# Patient Record
Sex: Female | Born: 1960
Health system: Southern US, Community
[De-identification: ages and names within clinical notes are randomized; demographics above are authoritative.]

## PROBLEM LIST (undated history)

## (undated) DIAGNOSIS — J45909 Unspecified asthma, uncomplicated: Secondary | ICD-10-CM

## (undated) HISTORY — DX: Unspecified asthma, uncomplicated: J45.909

---

## 1984-12-13 HISTORY — PX: TUBAL LIGATION: SHX77

## 2009-06-25 HISTORY — PX: FEMUR FRACTURE SURGERY: SHX633

## 2019-04-09 ENCOUNTER — Encounter: Payer: Self-pay | Admitting: Physician Assistant

## 2019-04-09 ENCOUNTER — Ambulatory Visit (INDEPENDENT_AMBULATORY_CARE_PROVIDER_SITE_OTHER): Payer: No Typology Code available for payment source | Admitting: Physician Assistant

## 2019-04-09 VITALS — Wt 260.0 lb

## 2019-04-09 DIAGNOSIS — Z7689 Persons encountering health services in other specified circumstances: Secondary | ICD-10-CM

## 2019-04-09 DIAGNOSIS — I890 Lymphedema, not elsewhere classified: Secondary | ICD-10-CM | POA: Diagnosis not present

## 2019-04-09 DIAGNOSIS — Z124 Encounter for screening for malignant neoplasm of cervix: Secondary | ICD-10-CM

## 2019-04-09 DIAGNOSIS — L819 Disorder of pigmentation, unspecified: Secondary | ICD-10-CM | POA: Diagnosis not present

## 2019-04-09 DIAGNOSIS — M545 Low back pain: Secondary | ICD-10-CM

## 2019-04-09 DIAGNOSIS — G8929 Other chronic pain: Secondary | ICD-10-CM

## 2019-04-09 NOTE — Progress Notes (Signed)
I have discussed the procedure for the virtual visit with the patient who has given consent to proceed with assessment and treatment.   Shamir Tuzzolino R Shanin Szymanowski, RN     
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
## 2019-05-07 ENCOUNTER — Encounter: Payer: Self-pay | Admitting: Orthopaedic Surgery

## 2019-05-07 ENCOUNTER — Ambulatory Visit (INDEPENDENT_AMBULATORY_CARE_PROVIDER_SITE_OTHER): Payer: No Typology Code available for payment source | Admitting: Orthopaedic Surgery

## 2019-05-07 ENCOUNTER — Other Ambulatory Visit: Payer: Self-pay

## 2019-05-07 ENCOUNTER — Ambulatory Visit (INDEPENDENT_AMBULATORY_CARE_PROVIDER_SITE_OTHER): Payer: No Typology Code available for payment source

## 2019-05-07 VITALS — Ht 62.0 in | Wt 260.0 lb

## 2019-05-07 DIAGNOSIS — G8929 Other chronic pain: Secondary | ICD-10-CM

## 2019-05-07 DIAGNOSIS — M545 Low back pain: Secondary | ICD-10-CM

## 2019-05-07 MED ORDER — PREDNISONE 10 MG (21) PO TBPK: ORAL_TABLET | ORAL | 0 refills | Status: DC

## 2019-05-07 MED ORDER — METHOCARBAMOL 500 MG PO TABS: 500.0000 mg | ORAL_TABLET | Freq: Two times a day (BID) | ORAL | 0 refills | Status: DC | PRN
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
## 2019-06-11 ENCOUNTER — Other Ambulatory Visit: Payer: Self-pay | Admitting: Obstetrics & Gynecology

## 2019-06-11 DIAGNOSIS — E2839 Other primary ovarian failure: Secondary | ICD-10-CM

## 2019-08-04 ENCOUNTER — Ambulatory Visit
Admission: RE | Admit: 2019-08-04 | Discharge: 2019-08-04 | Disposition: A | Discharge status: Home / Self Care | Payer: No Typology Code available for payment source | Source: Ambulatory Visit | Attending: Obstetrics & Gynecology | Admitting: Obstetrics & Gynecology

## 2019-08-04 ENCOUNTER — Other Ambulatory Visit: Payer: Self-pay

## 2019-08-04 DIAGNOSIS — E2839 Other primary ovarian failure: Secondary | ICD-10-CM

## 2019-10-29 ENCOUNTER — Other Ambulatory Visit: Payer: Self-pay

## 2019-10-29 ENCOUNTER — Ambulatory Visit (INDEPENDENT_AMBULATORY_CARE_PROVIDER_SITE_OTHER): Payer: No Typology Code available for payment source | Admitting: Physician Assistant

## 2019-10-29 ENCOUNTER — Encounter: Payer: Self-pay | Admitting: Physician Assistant

## 2019-10-29 VITALS — BP 116/82 | HR 70 | Temp 98.9°F | Resp 16 | Ht 62.0 in | Wt 263.2 lb

## 2019-10-29 DIAGNOSIS — Z0000 Encounter for general adult medical examination without abnormal findings: Secondary | ICD-10-CM

## 2019-10-29 LAB — CBC WITH DIFFERENTIAL/PLATELET
Basophils Absolute: 0.1 10*3/uL (ref 0.0–0.1)
Basophils Relative: 1.2 % (ref 0.0–3.0)
Eosinophils Absolute: 0.1 10*3/uL (ref 0.0–0.7)
Eosinophils Relative: 2.8 % (ref 0.0–5.0)
HCT: 43.2 % (ref 36.0–46.0)
Hemoglobin: 14.5 g/dL (ref 12.0–15.0)
Lymphocytes Relative: 30.5 % (ref 12.0–46.0)
Lymphs Abs: 1.6 10*3/uL (ref 0.7–4.0)
MCHC: 33.5 g/dL (ref 30.0–36.0)
MCV: 91.1 fl (ref 78.0–100.0)
Monocytes Absolute: 0.4 10*3/uL (ref 0.1–1.0)
Monocytes Relative: 8.4 % (ref 3.0–12.0)
Neutro Abs: 3 10*3/uL (ref 1.4–7.7)
Neutrophils Relative %: 57.1 % (ref 43.0–77.0)
Platelets: 254 10*3/uL (ref 150.0–400.0)
RBC: 4.74 Mil/uL (ref 3.87–5.11)
RDW: 13.1 % (ref 11.5–15.5)
WBC: 5.3 10*3/uL (ref 4.0–10.5)

## 2019-10-29 LAB — COMPREHENSIVE METABOLIC PANEL
ALT: 15 U/L (ref 0–35)
AST: 13 U/L (ref 0–37)
Albumin: 4.2 g/dL (ref 3.5–5.2)
Alkaline Phosphatase: 84 U/L (ref 39–117)
BUN: 15 mg/dL (ref 6–23)
CO2: 31 mEq/L (ref 19–32)
Calcium: 9.3 mg/dL (ref 8.4–10.5)
Chloride: 105 mEq/L (ref 96–112)
Creatinine, Ser: 0.7 mg/dL (ref 0.40–1.20)
GFR: 85.51 mL/min (ref >60.00)
Glucose, Bld: 96 mg/dL (ref 70–99)
Potassium: 3.9 mEq/L (ref 3.5–5.1)
Sodium: 141 mEq/L (ref 135–145)
Total Bilirubin: 0.6 mg/dL (ref 0.2–1.2)
Total Protein: 6.2 g/dL (ref 6.0–8.3)

## 2019-10-29 LAB — LIPID PANEL
Cholesterol: 172 mg/dL (ref 0–200)
HDL: 46.3 mg/dL (ref >39.00)
LDL Cholesterol: 110 mg/dL — ABNORMAL HIGH (ref 0–99)
NonHDL: 125.79
Total CHOL/HDL Ratio: 4
Triglycerides: 81 mg/dL (ref 0.0–149.0)
VLDL: 16.2 mg/dL (ref 0.0–40.0)

## 2019-10-29 LAB — HEMOGLOBIN A1C: Hgb A1c MFr Bld: 5.4 % (ref 4.6–6.5)

## 2019-10-29 LAB — TSH: TSH: 2.6 u[IU]/mL (ref 0.35–4.50)

## 2019-10-29 NOTE — Progress Notes (Signed)
Patient presents to clinic today for annual exam.  Patient is fasting for labs.  Diet -- Poor diet recently with COVID. Has started a class with RD recently. Wants to see a counselor about her issues with weight, emotions. Depressed mood or anhedonia.   Acute Concerns: Denies acute concerns today.   Health Maintenance: Immunizations -- Flu shot up-to-date. Tetanus up-to-date.  Colonoscopy -- Last in 2017. Normal per patient.  Had a [place].  She will look for records.  Mammogram -- UTD.  Had recently with GYN.  Records requested..  Normal per patient.  PAP -- Last in 2020 per patient. Normal per patient..  Hep C -- Has had previously negative.  HIV --declined screening.   History reviewed. No pertinent past medical history.  Past Surgical History:  Procedure Laterality Date  . FEMUR FRACTURE SURGERY Left 06/25/2009   compound  . TUBAL LIGATION  12/1984    Current Outpatient Medications on File Prior to Visit  Medication Sig Dispense Refill  . methocarbamol (ROBAXIN) 500 MG tablet Take 1 tablet (500 mg total) by mouth 2 (two) times daily as needed for muscle spasms. (Patient not taking: Reported on 10/29/2019) 20 tablet 0  . predniSONE (STERAPRED UNI-PAK 21 TAB) 10 MG (21) TBPK tablet Take as directed (Patient not taking: Reported on 10/29/2019) 21 tablet 0   No current facility-administered medications on file prior to visit.    No Known Allergies  Family History  Problem Relation Age of Onset  . Heart disease Mother   . Hypertension Mother   . COPD Father   . COPD Sister   . COPD Brother   . COPD Sister     Social History   Socioeconomic History  . Marital status: Married    Spouse name: Not on file  . Number of children: Not on file  . Years of education: Not on file  . Highest education level: Not on file  Occupational History  . Occupation: Dentist: [employer]  Tobacco Use  . Smoking status: Former Smoker    Types: Cigarettes      Quit date: 1983    Years since quitting: 38.0  . Smokeless tobacco: Never Used  Substance and Sexual Activity  . Alcohol use: Not Currently  . Drug use: Never  . Sexual activity: Not on file  Other Topics Concern  . Not on file  Social History Narrative  . Not on file   Social Determinants of Health   Financial Resource Strain:   . Difficulty of Paying Living Expenses: Not on file  Food Insecurity:   . Worried About Charity fundraiser in the Last Year: Not on file  . Ran Out of Food in the Last Year: Not on file  Transportation Needs:   . Lack of Transportation (Medical): Not on file  . Lack of Transportation (Non-Medical): Not on file  Physical Activity: Unknown  . Days of Exercise per Week: 0 days  . Minutes of Exercise per Session: Not on file  Stress:   . Feeling of Stress : Not on file  Social Connections:   . Frequency of Communication with Friends and Family: Not on file  . Frequency of Social Gatherings with Friends and Family: Not on file  . Attends Religious Services: Not on file  . Active Member of Clubs or Organizations: Not on file  . Attends Archivist Meetings: Not on file  . Marital Status: Not on file  Intimate Partner Violence:   . Fear of Current or Ex-Partner: Not on file  . Emotionally Abused: Not on file  . Physically Abused: Not on file  . Sexually Abused: Not on file   Review of Systems  Constitutional: Negative for fever and weight loss.  HENT: Negative for ear discharge, ear pain, hearing loss and tinnitus.   Eyes: Negative for blurred vision, double vision, photophobia and pain.  Respiratory: Negative for cough and shortness of breath.   Cardiovascular: Negative for chest pain and palpitations.  Gastrointestinal: Negative for abdominal pain, blood in stool, constipation, diarrhea, heartburn, melena, nausea and vomiting.  Genitourinary: Negative for dysuria, flank pain, frequency, hematuria and urgency.  Musculoskeletal:  Negative for falls.  Neurological: Negative for dizziness, loss of consciousness and headaches.  Endo/Heme/Allergies: Negative for environmental allergies.  Psychiatric/Behavioral: Negative for depression, hallucinations, substance abuse and suicidal ideas. The patient is not nervous/anxious and does not have insomnia.    BP 116/82   Pulse 70   Temp 98.9 F (37.2 C)   Resp 16   Ht 5' 2"  (1.575 m)   Wt 263 lb 3.2 oz (119.4 kg)   SpO2 98%   BMI 48.14 kg/m   Physical Exam Vitals reviewed.  HENT:     Head: Normocephalic and atraumatic.     Right Ear: Tympanic membrane, ear canal and external ear normal.     Left Ear: Tympanic membrane, ear canal and external ear normal.     Nose: Nose normal. No mucosal edema.     Mouth/Throat:     Pharynx: Uvula midline. No oropharyngeal exudate or posterior oropharyngeal erythema.  Eyes:     Conjunctiva/sclera: Conjunctivae normal.     Pupils: Pupils are equal, round, and reactive to light.  Neck:     Thyroid: No thyromegaly.  Cardiovascular:     Rate and Rhythm: Normal rate and regular rhythm.     Heart sounds: Normal heart sounds.  Pulmonary:     Effort: Pulmonary effort is normal. No respiratory distress.     Breath sounds: Normal breath sounds. No wheezing or rales.  Abdominal:     General: Bowel sounds are normal. There is no distension.     Palpations: Abdomen is soft. There is no mass.     Tenderness: There is no abdominal tenderness. There is no guarding or rebound.  Musculoskeletal:     Cervical back: Neck supple.  Lymphadenopathy:     Cervical: No cervical adenopathy.  Skin:    General: Skin is warm and dry.     Findings: No rash.  Neurological:     Mental Status: She is alert and oriented to person, place, and time.     Cranial Nerves: No cranial nerve deficit.    Assessment/Plan: 1. Visit for preventive health examination Depression screen negative. Health Maintenance reviewed Declines HIV screen. Has had before so  will document this. Prior neg Hep C screen per patient. Preventive schedule discussed and handout given in AVS. Will obtain fasting labs today.  - CBC w/Diff - Comp Met (CMET) - Hemoglobin A1c - Lipid Profile - TSH  2. Morbid obesity ([place]) Is seeing registered dietitian.  Dietary and exercise recommendations reviewed with patient.  She would like to see a counselor to discuss emotional eating and barriers to her weight loss.  Handout provided for our counseling services.  Patient to schedule appointment. - Comp Met (CMET) - Hemoglobin A1c - Lipid Profile - TSH This visit occurred during the SARS-CoV-2 public health emergency.  Safety protocols were in place, including screening questions prior to the visit, additional usage of staff PPE, and extensive cleaning of exam room while observing appropriate contact time as indicated for disinfecting solutions.      Leeanne Rio, PA-C

## 2019-10-29 NOTE — Patient Instructions (Signed)
Please go to the lab for blood work.   Our office will call you with your results unless you have chosen to receive results via MyChart.  If your blood work is normal we will follow-up each year for physicals and as scheduled for chronic medical problems.  If anything is abnormal we will treat accordingly and get you in for a follow-up.  Work up to a goal of 150 minutes of aerobic activity per week. Try to watch portion sizes.   Use the handout given to schedule a counseling appointment.    Preventive Care 46-35 Years Old, Female Preventive care refers to visits with your health care provider and lifestyle choices that can promote health and wellness. This includes:  A yearly physical exam. This may also be called an annual well check.  Regular dental visits and eye exams.  Immunizations.  Screening for certain conditions.  Healthy lifestyle choices, such as eating a healthy diet, getting regular exercise, not using drugs or products that contain nicotine and tobacco, and limiting alcohol use. What can I expect for my preventive care visit? Physical exam Your health care provider will check your:  Height and weight. This may be used to calculate body mass index (BMI), which tells if you are at a healthy weight.  Heart rate and blood pressure.  Skin for abnormal spots. Counseling Your health care provider may ask you questions about your:  Alcohol, tobacco, and drug use.  Emotional well-being.  Home and relationship well-being.  Sexual activity.  Eating habits.  Work and work Statistician.  Method of birth control.  Menstrual cycle.  Pregnancy history. What immunizations do I need?  Influenza (flu) vaccine  This is recommended every year. Tetanus, diphtheria, and pertussis (Tdap) vaccine  You may need a Td booster every 10 years. Varicella (chickenpox) vaccine  You may need this if you have not been vaccinated. Zoster (shingles) vaccine  You may need  this after age 40. Measles, mumps, and rubella (MMR) vaccine  You may need at least one dose of MMR if you were born in 1957 or later. You may also need a second dose. Pneumococcal conjugate (PCV13) vaccine  You may need this if you have certain conditions and were not previously vaccinated. Pneumococcal polysaccharide (PPSV23) vaccine  You may need one or two doses if you smoke cigarettes or if you have certain conditions. Meningococcal conjugate (MenACWY) vaccine  You may need this if you have certain conditions. Hepatitis A vaccine  You may need this if you have certain conditions or if you travel or work in places where you may be exposed to hepatitis A. Hepatitis B vaccine  You may need this if you have certain conditions or if you travel or work in places where you may be exposed to hepatitis B. Haemophilus influenzae type b (Hib) vaccine  You may need this if you have certain conditions. Human papillomavirus (HPV) vaccine  If recommended by your health care provider, you may need three doses over 6 months. You may receive vaccines as individual doses or as more than one vaccine together in one shot (combination vaccines). Talk with your health care provider about the risks and benefits of combination vaccines. What tests do I need? Blood tests  Lipid and cholesterol levels. These may be checked every 5 years, or more frequently if you are over 56 years old.  Hepatitis C test.  Hepatitis B test. Screening  Lung cancer screening. You may have this screening every year starting at age  55 if you have a 30-pack-year history of smoking and currently smoke or have quit within the past 15 years.  Colorectal cancer screening. All adults should have this screening starting at age 67 and continuing until age 20. Your health care provider may recommend screening at age 39 if you are at increased risk. You will have tests every 1-10 years, depending on your results and the type of  screening test.  Diabetes screening. This is done by checking your blood sugar (glucose) after you have not eaten for a while (fasting). You may have this done every 1-3 years.  Mammogram. This may be done every 1-2 years. Talk with your health care provider about when you should start having regular mammograms. This may depend on whether you have a family history of breast cancer.  BRCA-related cancer screening. This may be done if you have a family history of breast, ovarian, tubal, or peritoneal cancers.  Pelvic exam and Pap test. This may be done every 3 years starting at age 71. Starting at age 58, this may be done every 5 years if you have a Pap test in combination with an HPV test. Other tests  Sexually transmitted disease (STD) testing.  Bone density scan. This is done to screen for osteoporosis. You may have this scan if you are at high risk for osteoporosis. Follow these instructions at home: Eating and drinking  Eat a diet that includes fresh fruits and vegetables, whole grains, lean protein, and low-fat dairy.  Take vitamin and mineral supplements as recommended by your health care provider.  Do not drink alcohol if: ? Your health care provider tells you not to drink. ? You are pregnant, may be pregnant, or are planning to become pregnant.  If you drink alcohol: ? Limit how much you have to 0-1 drink a day. ? Be aware of how much alcohol is in your drink. In the [place] one drink equals one 12 oz bottle of beer (355 mL), one 5 oz glass of wine (148 mL), or one 1 oz glass of hard liquor (44 mL). Lifestyle  Take daily care of your teeth and gums.  Stay active. Exercise for at least 30 minutes on 5 or more days each week.  Do not use any products that contain nicotine or tobacco, such as cigarettes, e-cigarettes, and chewing tobacco. If you need help quitting, ask your health care provider.  If you are sexually active, practice safe sex. Use a condom or other form of birth  control (contraception) in order to prevent pregnancy and STIs (sexually transmitted infections).  If told by your health care provider, take low-dose aspirin daily starting at age 30. What's next?  Visit your health care provider once a year for a well check visit.  Ask your health care provider how often you should have your eyes and teeth checked.  Stay up to date on all vaccines. This information is not intended to replace advice given to you by your health care provider. Make sure you discuss any questions you have with your health care provider. Document Revised: 06/12/2018 Document Reviewed: 06/12/2018 Elsevier Patient Education  [employer]. .

## 2019-11-12 ENCOUNTER — Other Ambulatory Visit: Payer: Self-pay

## 2019-11-12 ENCOUNTER — Encounter: Payer: Self-pay | Admitting: Physician Assistant

## 2019-11-12 ENCOUNTER — Ambulatory Visit (INDEPENDENT_AMBULATORY_CARE_PROVIDER_SITE_OTHER): Payer: No Typology Code available for payment source | Admitting: Physician Assistant

## 2019-11-12 DIAGNOSIS — Z20822 Contact with and (suspected) exposure to covid-19: Secondary | ICD-10-CM | POA: Diagnosis not present

## 2019-11-12 MED ORDER — BENZONATATE 100 MG PO CAPS: 100.0000 mg | ORAL_CAPSULE | Freq: Three times a day (TID) | ORAL | 0 refills | Status: AC | PRN

## 2019-11-12 NOTE — Progress Notes (Signed)
I have discussed the procedure for the virtual visit with the patient who has given consent to proceed with assessment and treatment.   Patient unable to get vitals at the visit  Con Memos, CMA

## 2019-11-12 NOTE — Patient Instructions (Signed)
Instructions sent to MyChart.   Contact [facility] to let them know about symptoms and need for testing. They will likely set this up for you but I have listed the website for general testing sign up below.   [email or web address]  Please keep at home until results are in. Keep hydrated and get plenty of rest.  Continue OTC medications that you are taking. Start a Vitamin D3 1000 unit supplement daily, Vitamin C 1000 mg once daily and an OTC Zinc supplement.  Use the Tessalon I have sent in for cough.   I have enrolled you in a symptom monitoring program.  Please complete these daily.  If symptoms acutely worsen, you note significant shortness of breath, or chest discomfort, you need ER evaluation ASAP

## 2019-11-12 NOTE — Progress Notes (Signed)
   Virtual Visit via Video   I connected with patient on 11/12/19 at  8:30 AM EST by a video enabled telemedicine application and verified that I am speaking with the correct person using two identifiers.  Location patient: Home Location provider: [facility] Persons participating in the virtual visit: Patient, Provider, CMA (Patina Moore)  I discussed the limitations of evaluation and management by telemedicine and the availability of in person appointments. The patient expressed understanding and agreed to proceed.  Subjective:   HPI:   Patient presents via [facility] c/o 1 week of non-productive cough, chills, aches, change in smell, change in appetite. Denies fever, SOB, GI symptoms. Notes some thoracic back pain and chest wall pain with coughing. Denies pleuritic pain. Has been taking Tylenol-Sinus, Honey and some Ibuprofen. Has been going into work daily with current symptoms. Denies recent travel or sick contact.   ROS:   See pertinent positives and negatives per HPI.  There are no problems to display for this patient.   Social History   Tobacco Use  . Smoking status: Former Smoker    Types: Cigarettes    Quit date: 1983    Years since quitting: 38.1  . Smokeless tobacco: Never Used  Substance Use Topics  . Alcohol use: Not Currently   No current outpatient medications on file.  No Known Allergies  Objective:   There were no vitals taken for this visit.  Patient is well-developed, well-nourished in no acute distress.  Resting comfortably at home.  Head is normocephalic, atraumatic.  No labored breathing. Coughing steadily. No audible wheezing.  Speech is clear and coherent with logical content.  Patient is alert and oriented at baseline.   Assessment and Plan:   1. Suspected COVID-19 virus infection Patient sent for testing. She is to contact [facility] regarding symptoms and need for testing as well. Quarantine until results are in. Patient enrolled  in COVID monitoring program. Continue hydration and rest. Rx Tessalon. Continue Tylenol-Sinus. Start saline nasal rinses. Start Vit D, Vit C, Zinc.Strict ER precautions reviewed with patient.  -  MyChart COVID-19 home monitoring program; Future - Temperature monitoring; Future     Piedad Climes, PA-C 11/12/2019

## 2019-11-13 ENCOUNTER — Encounter (INDEPENDENT_AMBULATORY_CARE_PROVIDER_SITE_OTHER): Payer: Self-pay

## 2019-11-14 ENCOUNTER — Encounter (INDEPENDENT_AMBULATORY_CARE_PROVIDER_SITE_OTHER): Payer: Self-pay

## 2019-11-15 ENCOUNTER — Encounter (INDEPENDENT_AMBULATORY_CARE_PROVIDER_SITE_OTHER): Payer: Self-pay

## 2019-11-16 ENCOUNTER — Encounter (INDEPENDENT_AMBULATORY_CARE_PROVIDER_SITE_OTHER): Payer: Self-pay

## 2019-11-17 ENCOUNTER — Encounter (INDEPENDENT_AMBULATORY_CARE_PROVIDER_SITE_OTHER): Payer: Self-pay

## 2019-11-18 ENCOUNTER — Encounter (INDEPENDENT_AMBULATORY_CARE_PROVIDER_SITE_OTHER): Payer: Self-pay

## 2019-11-19 ENCOUNTER — Telehealth: Payer: Self-pay | Admitting: *Deleted

## 2019-11-19 ENCOUNTER — Encounter (INDEPENDENT_AMBULATORY_CARE_PROVIDER_SITE_OTHER): Payer: Self-pay

## 2019-11-19 NOTE — Telephone Encounter (Signed)
Left a detailed message on patient VM with PCP recommendations. See Below

## 2019-11-19 NOTE — Telephone Encounter (Signed)
Pt stated Dx with Covi-19 11/12/2019 Still with sx, congestion sore throat  Body ache and pain, no appetite, fatigue  Body ache worsen with out tylenol  Taking tylenol Q 4hr  Please Advised what he can do and how long it will take for Sx to change.

## 2019-11-19 NOTE — Telephone Encounter (Signed)
Usually around the 7-10 day mark we note that symptoms are either improving or potentially worsen. If she is noting any worsening symptoms I would recommend that she be seen at our [facility] respiratory clinic for examination, check of oxygen and potential x-ray to make sure there is no secondary pneumonia present.

## 2019-11-20 NOTE — Telephone Encounter (Signed)
The message taken was put in the wrong patient chart.

## 2019-12-03 ENCOUNTER — Other Ambulatory Visit: Payer: Self-pay | Admitting: Physician Assistant

## 2019-12-24 ENCOUNTER — Encounter: Payer: Self-pay | Admitting: Physician Assistant

## 2019-12-24 DIAGNOSIS — Z7689 Persons encountering health services in other specified circumstances: Secondary | ICD-10-CM

## 2020-02-11 ENCOUNTER — Encounter: Payer: Self-pay | Admitting: Allergy

## 2020-02-11 ENCOUNTER — Other Ambulatory Visit: Payer: Self-pay

## 2020-02-11 ENCOUNTER — Ambulatory Visit: Payer: No Typology Code available for payment source | Admitting: Allergy

## 2020-02-11 VITALS — BP 130/88 | HR 64 | Temp 98.0°F | Ht 62.5 in | Wt 258.4 lb

## 2020-02-11 DIAGNOSIS — J4520 Mild intermittent asthma, uncomplicated: Secondary | ICD-10-CM | POA: Diagnosis not present

## 2020-02-11 DIAGNOSIS — J3089 Other allergic rhinitis: Secondary | ICD-10-CM | POA: Diagnosis not present

## 2020-02-11 DIAGNOSIS — K9049 Malabsorption due to intolerance, not elsewhere classified: Secondary | ICD-10-CM

## 2020-02-11 DIAGNOSIS — H1013 Acute atopic conjunctivitis, bilateral: Secondary | ICD-10-CM | POA: Diagnosis not present

## 2020-02-11 MED ORDER — MONTELUKAST SODIUM 10 MG PO TABS
10.0000 mg | ORAL_TABLET | Freq: Every day | ORAL | 5 refills | Status: AC
Start: 2020-02-11 — End: ?

## 2020-02-11 MED ORDER — ALBUTEROL SULFATE HFA 108 (90 BASE) MCG/ACT IN AERS
2.0000 | INHALATION_SPRAY | Freq: Four times a day (QID) | RESPIRATORY_TRACT | 3 refills | Status: AC | PRN
Start: 2020-02-11 — End: ?

## 2020-02-11 MED ORDER — AZELASTINE-FLUTICASONE 137-50 MCG/ACT NA SUSP: 1.0000 | Freq: Two times a day (BID) | NASAL | 5 refills | Status: AC

## 2020-02-11 NOTE — Patient Instructions (Addendum)
-  Environmental allergy skin testing today is grass pollen, weed pollen, tree pollen, molds, dust mites.  -Allergen avoidance measures discussed/handouts provided -Continue Zyrtec 10 mg daily as needed -Recommend adding Singulair 10 mg daily to help with allergy symptom control as well as respiratory symptom control -Continue use of your Ayr nasal gel to help keep nose moisturized -Recommend using again Dymista 1 spray each nostril twice a day.  This is a combination nasal spray with Flonase + Astelin (nasal antihistamine).  This helps with both nasal congestion and drainage.  -Recommend performing nasal saline rinses to help flush out the nose.  Is best use prior to using your medicated nasal sprays.  Use either distilled water or boiled water and bring down to room temperature.  Breathe in and out of your mouth during the entire process.  Provided with a rinse kit today. -For itchy/watery/red eyes olopatadine 0.2% 1 drop each eye daily as needed -Allergen immunotherapy discussed today including protocol, benefits and risk.  Informational handout provided.  If interested in this therapuetic option you can check with your insurance carrier for coverage.  Let us know if you would like to proceed with this option.    -have access to albuterol inhaler 2 puffs every 4-6 hours as needed for cough/wheeze/shortness of breath/chest tightness.  May use 15-20 minutes prior to activity.   Monitor frequency of use.    -skin testing to mushroom is negative.  Continue avoidance of mushrooms in the diet  Follow-up 4-6 months or sooner if needed

## 2020-02-11 NOTE — Progress Notes (Signed)
New Patient Note  RE: Samantha Massey MRN: [id number] DOB: Dec 01, 1960 Date of Office Visit: 02/11/2020  Referring provider: Delorse Limber Primary care provider: Brunetta Jeans, PA-C  Chief Complaint: allergies  History of present illness: Samantha Massey is a 59 y.o. female presenting today for consultation for allergies.   She has seen an allergist before, last in 2014.  She recalls having positive testing for pollens and molds.  She states she was on allergy shots with a different allergist over 30 years and she believes they were helpful at that time.   She states during the winter 2020 she was constantly coughing and sneezing.  She states at the time she was tested for Covid that returned positive which was a surprise to her.  However she has continued to have issues with sneezing, cough and allergy symptoms.  She states this winter she had started to burn wood for fireplace and that started her cough and sneezing.   With tree pollen season she reports now she still has sneezing, itchy/watery eyes, nasal drainage and cough and sinus pressure.  She does report a slight wheeze with the cough.   Denies having sinus infections or needing antibiotics/steroids.   She will use nasal Ayr gel.  She does take zyrtec daily which does help.  Started taking zyrtec in the winter.  She states did have an inhaler past.  She has been on singulair in the past as well.    She has been told in the past she has asthma.  She currently doesn't have any inhaler medications.    No history of eczema.  She avoids mushrooms and recalls having difficulty breathing. With Bleu cheese she reports she developed trouble breathing and throat closure.  She is able to eat other dairy products with issue.  She believes with mushroom and bleu cheese her reactions are due to mold.    Review of systems: Review of Systems  Constitutional: Negative.   HENT:       See HPI  Eyes:       See HPI  Respiratory:       See HPI  Cardiovascular: Negative.   Gastrointestinal: Negative.   Musculoskeletal: Negative.   Skin: Negative.   Neurological: Negative.     All other systems negative unless noted above in HPI  Past medical history: Past Medical History:  Diagnosis Date  . Asthma     Past surgical history: Past Surgical History:  Procedure Laterality Date  . FEMUR FRACTURE SURGERY Left 06/25/2009   compound  . TUBAL LIGATION  12/1984    Family history:  Family History  Problem Relation Age of Onset  . Heart disease Mother   . Hypertension Mother   . COPD Father   . COPD Sister   . COPD Brother   . COPD Sister     Social history:  . Occupational History  . Occupation: Dentist: [employer]  Tobacco Use  . Smoking status: Former Smoker    Types: Cigarettes    Quit date: 1983    Years since quitting: 38.3  . Smokeless tobacco: Never Used  Lives in a home with carpeting with oil heating and central cooling.  1 dog in the home.  No concern for roaches in the home.  There is concern for water damage or mildew in the home.   Medication List: Current Outpatient Medications  Medication Sig Dispense Refill  . benzonatate (TESSALON PERLES) 100 MG capsule  Take 1 capsule (100 mg total) by mouth 3 (three) times daily as needed for cough. 30 capsule 0  . cetirizine (ZYRTEC) 10 MG tablet Take 10 mg by mouth daily.     No current facility-administered medications for this visit.    Known medication allergies: Allergies  Allergen Reactions  . Claritin-D 24 Hour [Loratadine-Pseudoephedrine Er] Palpitations     Physical examination: Blood pressure 130/88, pulse 64, temperature 98 F (36.7 C), temperature source Temporal, height 5' 2.5" (1.588 m), weight 258 lb 6.4 oz (117.2 kg), SpO2 94 %.  General: Alert, interactive, in no acute distress. HEENT: PERRLA, TMs pearly gray, turbinates moderately edematous without discharge, post-pharynx non  erythematous. Neck: Supple without lymphadenopathy. Lungs: Clear to auscultation without wheezing, rhonchi or rales. {no increased work of breathing. CV: Normal S1, S2 without murmurs. Abdomen: Nondistended, nontender. Skin: Warm and dry, without lesions or rashes. Extremities:  No clubbing, cyanosis or edema. Neuro:   Grossly intact.  Diagnositics/Labs:  Spirometry: FEV1: 2.12L 89%, FVC: 3.08L 100%, ratio consistent with nonobstructive pattern  Allergy testing: environmental allergy skin prick testing is positive to grass pollen, tree pollen, weed pollen, mold and dust mites. Skin prick testing to mushroom is negative.  Allergy testing results were read and interpreted by provider, documented by clinical staff.   Assessment and plan: Allergic rhinitis with conjunctivitis -Environmental allergy skin testing today is grass pollen, weed pollen, tree pollen, molds, dust mites.  -Allergen avoidance measures discussed/handouts provided -Continue Zyrtec 10 mg daily as needed -Recommend adding Singulair 10 mg daily to help with allergy symptom control as well as respiratory symptom control -Continue use of your Ayr nasal gel to help keep nose moisturized -Recommend using again Dymista 1 spray each nostril twice a day.  This is a combination nasal spray with Flonase + Astelin (nasal antihistamine).  This helps with both nasal congestion and drainage.  -Recommend performing nasal saline rinses to help flush out the nose.  Is best use prior to using your medicated nasal sprays.  Use either distilled water or boiled water and bring down to room temperature.  Breathe in and out of your mouth during the entire process.  Provided with a rinse kit today. -For itchy/watery/red eyes olopatadine 0.2% 1 drop each eye daily as needed -Allergen immunotherapy discussed today including protocol, benefits and risk.  Informational handout provided.  If interested in this therapuetic option you can check with your  insurance carrier for coverage.  Let us know if you would like to proceed with this option.    Allergic asthma -have access to albuterol inhaler 2 puffs every 4-6 hours as needed for cough/wheeze/shortness of breath/chest tightness.  May use 15-20 minutes prior to activity.   Monitor frequency of use.   -singulair as above  Food intolerance -skin testing to mushroom is negative.  Continue avoidance of mushrooms in the diet  Follow-up 4-6 months or sooner if needed   I appreciate the opportunity to take part in Samantha Massey's care. Please do not hesitate to contact me with questions.  Sincerely,   Prudy Feeler, MD Allergy/Immunology [facility]

## 2021-02-23 ENCOUNTER — Telehealth: Payer: Self-pay

## 2021-02-23 NOTE — Telephone Encounter (Signed)
  Patient was notified that provider has left, and they did not wish to schedule anything at this time.
